# Patient Record
Sex: Male | Born: 1965 | Race: White | Hispanic: No | Marital: Married | Smoking: Current every day smoker
Health system: Southern US, Community
[De-identification: ages and names within clinical notes are randomized; demographics above are authoritative.]

## PROBLEM LIST (undated history)

## (undated) DIAGNOSIS — R51 Headache: Secondary | ICD-10-CM

## (undated) DIAGNOSIS — Z8719 Personal history of other diseases of the digestive system: Secondary | ICD-10-CM

## (undated) DIAGNOSIS — R519 Headache, unspecified: Secondary | ICD-10-CM

---
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
## 2008-07-21 ENCOUNTER — Emergency Department (HOSPITAL_COMMUNITY): Admission: EM | Admit: 2008-07-21 | Discharge: 2008-07-21 | Payer: Self-pay | Admitting: Emergency Medicine

## 2010-07-22 ENCOUNTER — Emergency Department (HOSPITAL_COMMUNITY): Admission: EM | Admit: 2010-07-22 | Discharge: 2010-07-22 | Payer: Self-pay | Admitting: Emergency Medicine

## 2014-10-23 ENCOUNTER — Encounter (HOSPITAL_COMMUNITY)
Admission: EM | Disposition: A | Discharge status: Home / Self Care | Payer: Medicaid Other | Source: Other Acute Inpatient Hospital | Attending: Internal Medicine

## 2014-10-23 ENCOUNTER — Inpatient Hospital Stay (HOSPITAL_COMMUNITY)
Admission: EM | Admit: 2014-10-23 | Discharge: 2014-10-26 | DRG: 378 | Disposition: A | Discharge status: Home / Self Care | Payer: Medicaid Other | Source: Other Acute Inpatient Hospital | Attending: Internal Medicine | Admitting: Internal Medicine

## 2014-10-23 ENCOUNTER — Encounter (HOSPITAL_COMMUNITY): Payer: Self-pay | Admitting: Family Medicine

## 2014-10-23 DIAGNOSIS — G43909 Migraine, unspecified, not intractable, without status migrainosus: Secondary | ICD-10-CM | POA: Diagnosis present

## 2014-10-23 DIAGNOSIS — F17210 Nicotine dependence, cigarettes, uncomplicated: Secondary | ICD-10-CM | POA: Diagnosis present

## 2014-10-23 DIAGNOSIS — K922 Gastrointestinal hemorrhage, unspecified: Secondary | ICD-10-CM | POA: Diagnosis present

## 2014-10-23 DIAGNOSIS — R51 Headache: Secondary | ICD-10-CM

## 2014-10-23 DIAGNOSIS — D62 Acute posthemorrhagic anemia: Secondary | ICD-10-CM | POA: Diagnosis present

## 2014-10-23 DIAGNOSIS — K264 Chronic or unspecified duodenal ulcer with hemorrhage: Secondary | ICD-10-CM | POA: Diagnosis present

## 2014-10-23 DIAGNOSIS — K921 Melena: Secondary | ICD-10-CM | POA: Diagnosis present

## 2014-10-23 DIAGNOSIS — R519 Headache, unspecified: Secondary | ICD-10-CM | POA: Diagnosis present

## 2014-10-23 DIAGNOSIS — D649 Anemia, unspecified: Secondary | ICD-10-CM

## 2014-10-23 DIAGNOSIS — R000 Tachycardia, unspecified: Secondary | ICD-10-CM | POA: Diagnosis present

## 2014-10-23 DIAGNOSIS — Z720 Tobacco use: Secondary | ICD-10-CM | POA: Diagnosis present

## 2014-10-23 HISTORY — PX: ESOPHAGOGASTRODUODENOSCOPY: SHX5428

## 2014-10-23 HISTORY — DX: Personal history of other diseases of the digestive system: Z87.19

## 2014-10-23 HISTORY — DX: Headache, unspecified: R51.9

## 2014-10-23 HISTORY — DX: Headache: R51

## 2014-10-23 LAB — CBC
HEMATOCRIT: 18.9 % — AB (ref 39.0–52.0)
HEMATOCRIT: 21.8 % — AB (ref 39.0–52.0)
HEMOGLOBIN: 6.3 g/dL — AB (ref 13.0–17.0)
Hemoglobin: 7.4 g/dL — ABNORMAL LOW (ref 13.0–17.0)
MCH: 30.6 pg (ref 26.0–34.0)
MCH: 30 pg (ref 26.0–34.0)
MCHC: 33.3 g/dL (ref 30.0–36.0)
MCHC: 33.9 g/dL (ref 30.0–36.0)
MCV: 91.7 fL (ref 78.0–100.0)
MCV: 88.3 fL (ref 78.0–100.0)
Platelets: 197 10*3/uL (ref 150–400)
Platelets: 179 10*3/uL (ref 150–400)
RBC: 2.06 MIL/uL — ABNORMAL LOW (ref 4.22–5.81)
RBC: 2.47 MIL/uL — ABNORMAL LOW (ref 4.22–5.81)
RDW: 12.5 % (ref 11.5–15.5)
RDW: 12.7 % (ref 11.5–15.5)
WBC: 12.5 10*3/uL — AB (ref 4.0–10.5)
WBC: 11.5 10*3/uL — ABNORMAL HIGH (ref 4.0–10.5)

## 2014-10-23 LAB — COMPREHENSIVE METABOLIC PANEL
ALT: 9 U/L (ref 0–53)
ANION GAP: 8 (ref 5–15)
AST: 12 U/L (ref 0–37)
Albumin: 2.3 g/dL — ABNORMAL LOW (ref 3.5–5.2)
Alkaline Phosphatase: 43 U/L (ref 39–117)
BUN: 38 mg/dL — ABNORMAL HIGH (ref 6–23)
CALCIUM: 7.5 mg/dL — AB (ref 8.4–10.5)
CHLORIDE: 112 meq/L (ref 96–112)
CO2: 23 meq/L (ref 19–32)
CREATININE: 0.79 mg/dL (ref 0.50–1.35)
GFR calc non Af Amer: >90 mL/min (ref >90)
GLUCOSE: 116 mg/dL — AB (ref 70–99)
Potassium: 4.5 mEq/L (ref 3.7–5.3)
SODIUM: 143 meq/L (ref 137–147)
TOTAL PROTEIN: 4.5 g/dL — AB (ref 6.0–8.3)

## 2014-10-23 LAB — ABO/RH: ABO/RH(D): O POS

## 2014-10-23 LAB — LIPID PANEL
CHOLESTEROL: 105 mg/dL (ref 0–200)
HDL: 15 mg/dL — AB (ref >39)
LDL Cholesterol: 26 mg/dL (ref 0–99)
TRIGLYCERIDES: 318 mg/dL — AB (ref <150)
Total CHOL/HDL Ratio: 7 RATIO
VLDL: 64 mg/dL — AB (ref 0–40)

## 2014-10-23 LAB — APTT: aPTT: 29 seconds (ref 24–37)

## 2014-10-23 LAB — MRSA PCR SCREENING: MRSA BY PCR: POSITIVE — AB

## 2014-10-23 LAB — GLUCOSE, CAPILLARY: GLUCOSE-CAPILLARY: 122 mg/dL — AB (ref 70–99)

## 2014-10-23 LAB — PROTIME-INR
INR: 1.18 (ref 0.00–1.49)
PROTHROMBIN TIME: 15.1 s (ref 11.6–15.2)

## 2014-10-23 LAB — PREPARE RBC (CROSSMATCH)

## 2014-10-23 LAB — MAGNESIUM: Magnesium: 1.8 mg/dL (ref 1.5–2.5)

## 2014-10-23 LAB — TROPONIN I: Troponin I: <0.3 ng/mL (ref <0.30)

## 2014-10-23 SURGERY — EGD (ESOPHAGOGASTRODUODENOSCOPY)
Anesthesia: Moderate Sedation

## 2014-10-23 MED ORDER — DIPHENHYDRAMINE HCL 50 MG/ML IJ SOLN
INTRAMUSCULAR | Status: AC
  Filled 2014-10-23: qty 1

## 2014-10-23 MED ORDER — SODIUM CHLORIDE 0.45 % IV SOLN
INTRAVENOUS | Status: DC
  Administered 2014-10-23 – 2014-10-25 (×5): via INTRAVENOUS

## 2014-10-23 MED ORDER — SODIUM CHLORIDE 0.9 % IV SOLN
8.0000 mg/h | INTRAVENOUS | Status: AC
  Administered 2014-10-23 – 2014-10-25 (×5): 8 mg/h via INTRAVENOUS
  Filled 2014-10-23 (×13): qty 80

## 2014-10-23 MED ORDER — MUPIROCIN 2 % EX OINT
1.0000 "application " | TOPICAL_OINTMENT | Freq: Two times a day (BID) | CUTANEOUS | Status: DC
  Administered 2014-10-23 – 2014-10-25 (×6): 1 via NASAL
  Filled 2014-10-23 (×2): qty 22

## 2014-10-23 MED ORDER — FENTANYL CITRATE 0.05 MG/ML IJ SOLN
INTRAMUSCULAR | Status: AC
  Filled 2014-10-23: qty 2

## 2014-10-23 MED ORDER — PANTOPRAZOLE SODIUM 40 MG IV SOLR
40.0000 mg | Freq: Two times a day (BID) | INTRAVENOUS | Status: DC
  Filled 2014-10-23 (×2): qty 40

## 2014-10-23 MED ORDER — SODIUM CHLORIDE 0.9 % IV SOLN
Freq: Once | INTRAVENOUS | Status: AC
  Administered 2014-10-23: 07:00:00 via INTRAVENOUS

## 2014-10-23 MED ORDER — FENTANYL CITRATE 0.05 MG/ML IJ SOLN
INTRAMUSCULAR | Status: DC | PRN
  Administered 2014-10-23 (×2): 25 ug via INTRAVENOUS

## 2014-10-23 MED ORDER — MORPHINE SULFATE 4 MG/ML IJ SOLN
4.0000 mg | INTRAMUSCULAR | Status: DC | PRN
  Administered 2014-10-23 – 2014-10-25 (×10): 4 mg via INTRAVENOUS
  Filled 2014-10-23 (×10): qty 1

## 2014-10-23 MED ORDER — SODIUM CHLORIDE 0.9 % IV SOLN: INTRAVENOUS | Status: DC

## 2014-10-23 MED ORDER — ONDANSETRON HCL 4 MG/2ML IJ SOLN
4.0000 mg | Freq: Once | INTRAMUSCULAR | Status: AC
  Administered 2014-10-23: 4 mg via INTRAVENOUS
  Filled 2014-10-23: qty 2

## 2014-10-23 MED ORDER — ONDANSETRON HCL 4 MG PO TABS
4.0000 mg | ORAL_TABLET | Freq: Four times a day (QID) | ORAL | Status: DC | PRN
Start: 2014-10-23 — End: 2014-10-26

## 2014-10-23 MED ORDER — NICOTINE 21 MG/24HR TD PT24
21.0000 mg | MEDICATED_PATCH | Freq: Every day | TRANSDERMAL | Status: DC
  Administered 2014-10-23 – 2014-10-25 (×3): 21 mg via TRANSDERMAL
  Filled 2014-10-23 (×4): qty 1

## 2014-10-23 MED ORDER — ACETAMINOPHEN 10 MG/ML IV SOLN
1000.0000 mg | Freq: Once | INTRAVENOUS | Status: AC
  Administered 2014-10-23: 1000 mg via INTRAVENOUS
  Filled 2014-10-23: qty 100

## 2014-10-23 MED ORDER — MIDAZOLAM HCL 10 MG/2ML IJ SOLN
INTRAMUSCULAR | Status: DC | PRN
  Administered 2014-10-23: 1 mg via INTRAVENOUS
  Administered 2014-10-23: 1.5 mg via INTRAVENOUS
  Administered 2014-10-23: 2 mg via INTRAVENOUS

## 2014-10-23 MED ORDER — SODIUM CHLORIDE 0.9 % IV SOLN: Freq: Once | INTRAVENOUS | Status: AC

## 2014-10-23 MED ORDER — BUTAMBEN-TETRACAINE-BENZOCAINE 2-2-14 % EX AERO
INHALATION_SPRAY | CUTANEOUS | Status: DC | PRN
  Administered 2014-10-23 (×2): 1 via TOPICAL

## 2014-10-23 MED ORDER — TRAMADOL HCL 50 MG PO TABS
50.0000 mg | ORAL_TABLET | Freq: Four times a day (QID) | ORAL | Status: DC | PRN
  Administered 2014-10-23 – 2014-10-25 (×7): 100 mg via ORAL
  Filled 2014-10-23 (×7): qty 2

## 2014-10-23 MED ORDER — SODIUM CHLORIDE 0.9 % IV BOLUS (SEPSIS)
1000.0000 mL | Freq: Once | INTRAVENOUS | Status: AC
  Administered 2014-10-23: 1000 mL via INTRAVENOUS

## 2014-10-23 MED ORDER — ONDANSETRON HCL 4 MG/2ML IJ SOLN: 4.0000 mg | Freq: Four times a day (QID) | INTRAMUSCULAR | Status: DC | PRN

## 2014-10-23 MED ORDER — ACETAMINOPHEN 650 MG RE SUPP: 650.0000 mg | Freq: Four times a day (QID) | RECTAL | Status: DC | PRN

## 2014-10-23 MED ORDER — DEXAMETHASONE SODIUM PHOSPHATE 10 MG/ML IJ SOLN
10.0000 mg | Freq: Once | INTRAMUSCULAR | Status: AC
  Administered 2014-10-23: 10 mg via INTRAVENOUS
  Filled 2014-10-23: qty 1

## 2014-10-23 MED ORDER — MIDAZOLAM HCL 5 MG/ML IJ SOLN
INTRAMUSCULAR | Status: AC
  Filled 2014-10-23: qty 2

## 2014-10-23 MED ORDER — ACETAMINOPHEN 325 MG PO TABS: 650.0000 mg | ORAL_TABLET | Freq: Four times a day (QID) | ORAL | Status: DC | PRN

## 2014-10-23 MED ORDER — CHLORHEXIDINE GLUCONATE CLOTH 2 % EX PADS
6.0000 | MEDICATED_PAD | Freq: Every day | CUTANEOUS | Status: DC
  Administered 2014-10-23 – 2014-10-26 (×4): 6 via TOPICAL

## 2014-10-23 NOTE — H&P (View-Only) (Signed)
[facility] Consultation Note  Referring Provider: Dr. Butler Denmarkizwan [facility]  Reason for Consultation:  Melena, anemia  HPI: Teena IraniWilliam R Siglin is a 48 y.o. male transferred [facility] for evaluation of anemia and melena.  History of migraine headaches, for which he has been taking multiple NSAIDs of various types for the past couple months.  Few days ago, patient began having several black tarry stools daily, and this has persisted.  Mild epigastric discomfort.  Some nausea and vomiting.  Recently on amoxicillin for possible sinus infection.  Possible hematemesis last week, patient uncertain.  Had similar episode of bleeding about 30 years ago, diagnosed with stomach ulcer via what sounds like UGI series test.     Past Medical History  Diagnosis Date  . H/O: GI bleed 1978  . Headache     History reviewed. No pertinent past surgical history.  Prior to Admission medications   Not on File    Current Facility-Administered Medications  Medication Dose Route Frequency Provider Last Rate Last Dose  . 0.45 % sodium chloride infusion   Intravenous Continuous Ozella Rocksavid J Merrell, MD      . Chlorhexidine Gluconate Cloth 2 % PADS 6 each  6 each Topical Q0600 Ozella Rocksavid J Merrell, MD   6 each at 10/23/14 0100  . morphine 4 MG/ML injection 4 mg  4 mg Intravenous Q4H PRN Ozella Rocksavid J Merrell, MD   4 mg at 10/23/14 0854  . mupirocin ointment (BACTROBAN) 2 % 1 application  1 application Nasal BID Ozella Rocksavid J Merrell, MD   1 application at 10/23/14 1021  . nicotine (NICODERM CQ - dosed in mg/24 hours) patch 21 mg  21 mg Transdermal Daily Ozella Rocksavid J Merrell, MD   21 mg at 10/23/14 1021  . ondansetron (ZOFRAN) tablet 4 mg  4 mg Oral Q6H PRN Ozella Rocksavid J Merrell, MD       Or  . ondansetron [facility](ZOFRAN) injection 4 mg  4 mg Intravenous Q6H PRN Ozella Rocksavid J Merrell, MD      . pantoprazole (PROTONIX) 80 mg in sodium chloride 0.9 % 250 mL (0.32 mg/mL) infusion  8 mg/hr Intravenous Continuous Ozella Rocksavid J Merrell, MD 25 mL/hr at 10/23/14 0241 8  mg/hr at 10/23/14 0241  . [START ON 10/26/2014] pantoprazole (PROTONIX) injection 40 mg  40 mg Intravenous Q12H Ozella Rocksavid J Merrell, MD        Allergies as of 10/22/2014  . (Not on File)    History reviewed. No pertinent family history.  History   Social History  . Marital Status: Married    Spouse Name: N/A    Number of Children: N/A  . Years of Education: N/A   Occupational History  . Not on file.   Social History Main Topics  . Smoking status: Current Every Day Smoker -- 1.00 packs/day    Types: Cigarettes  . Smokeless tobacco: Not on file  . Alcohol Use: Yes     Comment: occasional  . Drug Use: Not on file  . Sexual Activity: Not on file   Other Topics Concern  . Not on file   Social History Narrative  . No narrative on file    Review of Systems: ROS Dr. Konrad DoloresMerrell 10/23/14 reviewed and I agree  Physical Exam: Vital signs in last 24 hours: Temp:  [97.5 F (36.4 C)-98.7 F (37.1 C)] 97.5 F (36.4 C) (10/31 0930) Pulse Rate:  [76-103] 81 (10/31 1000) Resp:  [8-18] 10 (10/31 1000) BP: (98-122)/(53-75) 110/61 mmHg (10/31 1000) SpO2:  [98 %-100 %] 98 % (  10/31 1000) Weight:  [92.7 kg (204 lb 5.9 oz)] 92.7 kg (204 lb 5.9 oz) (10/31 0037) Last BM Date: 10/22/14 General:   Alert,  Well-developed, well-nourished, pleasant and cooperative in NAD Head:  Normocephalic and atraumatic. Eyes:  Sclera clear, no icterus.   Conjunctiva pale Ears:  Normal auditory acuity. Nose:  No deformity, discharge,  or lesions. Mouth:  No deformity or lesions.  Oropharynx pale and dry Neck:  Supple; no masses or thyromegaly. Lungs:  Clear throughout to auscultation.   No wheezes, crackles, or rhonchi. No acute distress. Heart:  Regular rate and rhythm; no murmurs, clicks, rubs,  or gallops. Abdomen:  Soft, mild epigastric tenderness, no peritonitis.  Active bowel sounds. No masses, hepatosplenomegaly or hernias noted.   Msk:  Symmetrical without gross deformities. Normal posture. Pulses:   Normal pulses noted. Extremities:  Without clubbing or edema. Neurologic:  Alert and  oriented x4;  Diffusely weak, otherwise grossly normal neurologically. Skin:  Intact without significant lesions or rashes. Psych:  Alert and cooperative. Normal mood and affect.   Lab Results:  Recent Labs  10/23/14 0417  WBC 12.5*  HGB 6.3*  HCT 18.9*  PLT 197   BMET  Recent Labs  10/23/14 0417  NA 143  K 4.5  CL 112  CO2 23  GLUCOSE 116*  BUN 38*  CREATININE 0.79  CALCIUM 7.5*   LFT  Recent Labs  10/23/14 0417  PROT 4.5*  ALBUMIN 2.3*  AST 12  ALT 9  ALKPHOS 43  BILITOT <0.2*   PT/INR  Recent Labs  10/23/14 0417  LABPROT 15.1  INR 1.18    Studies/Results: No results found.  Impression:  1.  Melena.  NSAID use.  Suspect peptic ulcer. 2.  Acute blood loss anemia, from #1 above.  Plan:  1.  Continue pantoprazole infustion. 2.  Continued volume repletion and transfusion (just finished one unit of PRBCs). 3.  Endoscopy today. 4.  Risks (bleeding, infection, bowel perforation that could require surgery, sedation-related changes in cardiopulmonary systems), benefits (identification and possible treatment of source of symptoms, exclusion of certain causes of symptoms), and alternatives (watchful waiting, radiographic imaging studies, empiric medical treatment) of upper endoscopy (EGD) were explained to patient/family in detail and patient wishes to proceed.   LOS: 0 days   Shanyah Gattuso,Raijon M  10/23/2014, 10:35 AM

## 2014-10-23 NOTE — Consult Note (Signed)
[facility] Consultation Note  Referring Provider: Dr. Butler Denmarkizwan [facility]  Reason for Consultation:  Melena, anemia  HPI: Steve James is a 48 y.o. male transferred from [facility] for evaluation of anemia and melena.  History of migraine headaches, for which he has been taking multiple NSAIDs of various types for the past couple months.  Few days ago, patient began having several black tarry stools daily, and this has persisted.  Mild epigastric discomfort.  Some nausea and vomiting.  Recently on amoxicillin for possible sinus infection.  Possible hematemesis last week, patient uncertain.  Had similar episode of bleeding about 30 years ago, diagnosed with stomach ulcer via what sounds like UGI series test.     Past Medical History  Diagnosis Date  . H/O: GI bleed 1978  . Headache     History reviewed. No pertinent past surgical history.  Prior to Admission medications   Not on File    Current Facility-Administered Medications  Medication Dose Route Frequency Provider Last Rate Last Dose  . 0.45 % sodium chloride infusion   Intravenous Continuous Ozella Rocksavid J Merrell, MD      . Chlorhexidine Gluconate Cloth 2 % PADS 6 each  6 each Topical Q0600 Ozella Rocksavid J Merrell, MD   6 each at 10/23/14 0100  . morphine 4 MG/ML injection 4 mg  4 mg Intravenous Q4H PRN Ozella Rocksavid J Merrell, MD   4 mg at 10/23/14 0854  . mupirocin ointment (BACTROBAN) 2 % 1 application  1 application Nasal BID Ozella Rocksavid J Merrell, MD   1 application at 10/23/14 1021  . nicotine (NICODERM CQ - dosed in mg/24 hours) patch 21 mg  21 mg Transdermal Daily Ozella Rocksavid J Merrell, MD   21 mg at 10/23/14 1021  . ondansetron (ZOFRAN) tablet 4 mg  4 mg Oral Q6H PRN Ozella Rocksavid J Merrell, MD       Or  . ondansetron [facility](ZOFRAN) injection 4 mg  4 mg Intravenous Q6H PRN Ozella Rocksavid J Merrell, MD      . pantoprazole (PROTONIX) 80 mg in sodium chloride 0.9 % 250 mL (0.32 mg/mL) infusion  8 mg/hr Intravenous Continuous Ozella Rocksavid J Merrell, MD 25 mL/hr at 10/23/14 0241 8  mg/hr at 10/23/14 0241  . [START ON 10/26/2014] pantoprazole (PROTONIX) injection 40 mg  40 mg Intravenous Q12H Ozella Rocksavid J Merrell, MD        Allergies as of 10/22/2014  . (Not on File)    History reviewed. No pertinent family history.  History   Social History  . Marital Status: Married    Spouse Name: N/A    Number of Children: N/A  . Years of Education: N/A   Occupational History  . Not on file.   Social History Main Topics  . Smoking status: Current Every Day Smoker -- 1.00 packs/day    Types: Cigarettes  . Smokeless tobacco: Not on file  . Alcohol Use: Yes     Comment: occasional  . Drug Use: Not on file  . Sexual Activity: Not on file   Other Topics Concern  . Not on file   Social History Narrative  . No narrative on file    Review of Systems: ROS Dr. Konrad DoloresMerrell 10/23/14 reviewed and I agree  Physical Exam: Vital signs in last 24 hours: Temp:  [97.5 F (36.4 C)-98.7 F (37.1 C)] 97.5 F (36.4 C) (10/31 0930) Pulse Rate:  [76-103] 81 (10/31 1000) Resp:  [8-18] 10 (10/31 1000) BP: (98-122)/(53-75) 110/61 mmHg (10/31 1000) SpO2:  [98 %-100 %] 98 % (  10/31 1000) Weight:  [92.7 kg (204 lb 5.9 oz)] 92.7 kg (204 lb 5.9 oz) (10/31 0037) Last BM Date: 10/22/14 General:   Alert,  Well-developed, well-nourished, pleasant and cooperative in NAD Head:  Normocephalic and atraumatic. Eyes:  Sclera clear, no icterus.   Conjunctiva pale Ears:  Normal auditory acuity. Nose:  No deformity, discharge,  or lesions. Mouth:  No deformity or lesions.  Oropharynx pale and dry Neck:  Supple; no masses or thyromegaly. Lungs:  Clear throughout to auscultation.   No wheezes, crackles, or rhonchi. No acute distress. Heart:  Regular rate and rhythm; no murmurs, clicks, rubs,  or gallops. Abdomen:  Soft, mild epigastric tenderness, no peritonitis.  Active bowel sounds. No masses, hepatosplenomegaly or hernias noted.   Msk:  Symmetrical without gross deformities. Normal posture. Pulses:   Normal pulses noted. Extremities:  Without clubbing or edema. Neurologic:  Alert and  oriented x4;  Diffusely weak, otherwise grossly normal neurologically. Skin:  Intact without significant lesions or rashes. Psych:  Alert and cooperative. Normal mood and affect.   Lab Results:  Recent Labs  10/23/14 0417  WBC 12.5*  HGB 6.3*  HCT 18.9*  PLT 197   BMET  Recent Labs  10/23/14 0417  NA 143  K 4.5  CL 112  CO2 23  GLUCOSE 116*  BUN 38*  CREATININE 0.79  CALCIUM 7.5*   LFT  Recent Labs  10/23/14 0417  PROT 4.5*  ALBUMIN 2.3*  AST 12  ALT 9  ALKPHOS 43  BILITOT <0.2*   PT/INR  Recent Labs  10/23/14 0417  LABPROT 15.1  INR 1.18    Studies/Results: No results found.  Impression:  1.  Melena.  NSAID use.  Suspect peptic ulcer. 2.  Acute blood loss anemia, from #1 above.  Plan:  1.  Continue pantoprazole infustion. 2.  Continued volume repletion and transfusion (just finished one unit of PRBCs). 3.  Endoscopy today. 4.  Risks (bleeding, infection, bowel perforation that could require surgery, sedation-related changes in cardiopulmonary systems), benefits (identification and possible treatment of source of symptoms, exclusion of certain causes of symptoms), and alternatives (watchful waiting, radiographic imaging studies, empiric medical treatment) of upper endoscopy (EGD) were explained to patient/family in detail and patient wishes to proceed.   LOS: 0 days   Sophonie Goforth,Anas M  10/23/2014, 10:35 AM   

## 2014-10-23 NOTE — Progress Notes (Signed)
CRITICAL VALUE ALERT  Critical value received:  + MRSA swab  Date of notification:  10/23/2014  Time of notification:  4:50 AM  Critical value read back:Yes.    Nurse who received alert:  Lorenda CahillHopper, Donzell Coller Anderson1  MD notified (1st page):  Dr. Romeo AppleMerrrell  Time of first page:  4:50 AM  MD notified (2nd page):  Time of second page:  Responding MD:  Per protocol  Time MD responded:  4:50 AM

## 2014-10-23 NOTE — Progress Notes (Signed)
CRITICAL VALUE ALERT  Critical value received:  hgb 6.3  Date of notification:  10/23/2014  Time of notification:  0619  Critical value read back:Yes.    Nurse who received alert:  Marcellina MillinMindy Hopper RN reported to Crist FatJoy Zarin Knupp RN  MD notified (1st page):  Rexford MausMary Welch NP-[facility]   Time of first page:  0630  MD notified (2nd page):  Time of second page:  Responding MD:  Rexford MausWelch, Mary  Time MD responded:  [phone]

## 2014-10-23 NOTE — Interval H&P Note (Signed)
History and Physical Interval Note:  10/23/2014 11:36 AM  Steve James  has presented today for surgery, with the diagnosis of melena  The various methods of treatment have been discussed with the patient and family. After consideration of risks, benefits and other options for treatment, the patient has consented to  Procedure(s): ESOPHAGOGASTRODUODENOSCOPY (EGD) (N/A) as a surgical intervention .  The patient's history has been reviewed, patient examined, no change in status, stable for surgery.  I have reviewed the patient's chart and labs.  Questions were answered to the patient's satisfaction.     Steve James,Steve James  Assessment:  1.  Melena. 2.  Anemia.  Plan:  1.  Endoscopy. 2.  Risks (bleeding, infection, bowel perforation that could require surgery, sedation-related changes in cardiopulmonary systems), benefits (identification and possible treatment of source of symptoms, exclusion of certain causes of symptoms), and alternatives (watchful waiting, radiographic imaging studies, empiric medical treatment) of upper endoscopy (EGD) were explained to patient/family in detail and patient wishes to proceed.

## 2014-10-23 NOTE — Op Note (Signed)
[facility] [address] [place], [id number]   ENDOSCOPY PROCEDURE REPORT  PATIENT: Steve James, Steve James  MR#: [id number] BIRTHDATE: 02/17/1966 , 48  yrs. old GENDER: male ENDOSCOPIST: Willis ModenaWilliam Glenys Snader, MD REFERRED BY:  [facility] PROCEDURE DATE:  10/23/2014 PROCEDURE:  EGD, diagnostic ASA CLASS:     Class II INDICATIONS:  melena, anemia. MEDICATIONS: Fentanyl 50 mcg IV and Versed 5.5 mg IV TOPICAL ANESTHETIC: Cetacaine Spray DESCRIPTION OF PROCEDURE: After the risks benefits and alternatives of the procedure were thoroughly explained, informed consent was obtained.  The Pentax Gastroscope [id number] endoscope was introduced through the mouth and advanced to the second portion of the duodenum. The instrument was slowly withdrawn as the mucosa was fully examined.  Findings:   Normal esophagus.  Streaks of red blood in fundus; after extensive lavage, no focal gastric bleeding source was seen. Retroflexion into the cardia was normal.  In the distal duodenal bulb, an adherent clot was identified.  Clot was gently evacuated, revealing a large deep distal duodenal bulbar ulcer.  There were couple pigmented spots within the ulcer, but no obvious visible vessel was seen that would have been amenable to clipping.  No focal region was identified to perform electrocautery on either, and risk for cautery-mediated perforation in such a deep ulcer was deemed not insignifcant.  No active bleeding was witnessed from the ulcer, after clot evacuation, despite extensive lavage.  There was some post-bulbar edema and stenosis, likely related to the ulcer, but the diagnostic endoscope was able to be maneuvered through this region and into the second portion of the duodenum, which was normal.              The scope was then withdrawn from the patient and the procedure completed.  COMPLICATIONS: There were no immediate complications.  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  No active bleeding from ulcer bed witnessed.    Patient is at a not insignificant risk for rebleeding but, unfortunately, there was no focal site to endoscopically treat that would decrease this interval risk.  RECOMMENDATIONS:     1.  Watch for potential complications of procedure. 2.  Pantoprazole infusion/drip for 72 hours. 3.  Follow serial CBCs, patient may very well require further transfusions. 4.  No NSAIDs. 5.  Ice Chips for now only; if no further rebleeding throughout the rest of the day, might consider clear liquid diet for dinner tonight. 6.  If patient has further rebleeding, would consider Interventional Radiology consultation for embolization of the gastroduodenal artery, as there is no role for endoscopy should this patient rebleed.  eSigned:  Willis ModenaWilliam Shenequa Howse, MD 10/23/2014 12:19 PM   CC: CPT CODES: ICD CODES:

## 2014-10-23 NOTE — H&P (Addendum)
[facility] History and Physical  Steve IraniWilliam R James UJW:[id number] DOB: [birth year] DOA: 10/23/2014  [facility] PCP: No primary provider on file.   Chief Complaint: lightheadedness and headache  HPI: Steve James is a 48 y.o. male  Started on Amoxicillin on Monday for a sinus infection. Started bloody diarrhea on Tuesday. Stopped the ABX but continued w/ bloody diarrhea 2-3x daily. Became lightheaded today. Associated w/ epigastric burning pain. Feels like his bleeding ulcer that he had in 1978. Pt takes up to 5 advil and 2 excedrine for frequent HAs (3 per week).  Increased use of NSAIDs over the past month. [facility] and transferred here for GI evaluation. Started on NS 250mg /hr and protonix drip.   HAs area throbbing in nature, on one side of head. Associated w/ photophobia or phonophobia, nausea, and weakness. If able to take medications at the beginning of the HA then it goes away. If unable to get medications early then it will last for a day.   Smokes 1ppd.   Review of Systems:  Constitutional:  No weight loss, night sweats, Fevers, chills, HEENT:  No difficulty swallowing,Tooth/dental problems,Sore throat,  No sneezing, itching, ear ache, nasal congestion, post nasal drip,  Cardio-vascular:  No chest pain, Orthopnea, PND, swelling in lower extremities, anasarca, dizziness, palpitations  GI:  PER HPI Resp:  No shortness of breath with exertion or at rest. No productive cough, No non-productive cough, No coughing up of blood.No change in color of mucus.No wheezing.No chest wall deformity  Skin:  no rash or lesions.  GU:  no dysuria, change in color of urine, no urgency or frequency. No flank pain.  Musculoskeletal:  No joint pain or swelling. No decreased range of motion. No back pain.  Psych:  No change in mood or affect. No depression or anxiety. No memory loss.   Past Medical History  Diagnosis Date  . H/O: GI bleed  1978   No past surgical history on file. Social History:  reports that he has been smoking Cigarettes.  He has been smoking about 1.00 pack per day. He does not have any smokeless tobacco history on file. He reports that he drinks alcohol. His drug history is not on file.  Allergies  Allergen Reactions  . Aspirin Nausea Only  . Bee Pollen     Bee pollen and stings    No family history on file.   Prior to Admission medications   Not on File   Physical Exam: Filed Vitals:   10/23/14 0100 10/23/14 0115 10/23/14 0130 10/23/14 0200  BP: 109/66 107/72 119/60 114/64  Pulse: 96 88 103 91  Temp:      TempSrc:      Resp: 13 13 18 11   Height:      Weight:      SpO2: 100% 100% 100% 100%    Wt Readings from Last 3 Encounters:  10/23/14 92.7 kg (204 lb 5.9 oz)    General: Appears calm and comfortable Eyes: EOMI, PERRL, normal lids, irises & conjunctiva ENT: Dry mm, no nasal congestion  Neck: no LAD, masses or thyromegaly Cardiovascular: RRR, no m/r/g. No LE edema. Telemetry: SR, no arrhythmias  Respiratory: CTA bilaterally, no w/r/r. Normal respiratory effort. Abdomen: soft, ntnd Skin: Pale appearing, no rash or induration seen on limited exam Musculoskeletal: grossly normal tone BUE/BLE Psychiatric: grossly normal mood and affect, speech fluent and appropriate Neurologic:  grossly non-focal.          Labs on Admission:  Basic Metabolic Panel: No results found for this basename: NA, K, CL, CO2, GLUCOSE, BUN, CREATININE, CALCIUM, MG, PHOS,  in the last 168 hours Liver Function Tests: No results found for this basename: AST, ALT, ALKPHOS, BILITOT, PROT, ALBUMIN,  in the last 168 hours No results found for this basename: LIPASE, AMYLASE,  in the last 168 hours No results found for this basename: AMMONIA,  in the last 168 hours CBC: No results found for this basename: WBC, NEUTROABS, HGB, HCT, MCV, PLT,  in the last 168 hours Cardiac Enzymes: No results found for this  basename: CKTOTAL, CKMB, CKMBINDEX, TROPONINI,  in the last 168 hours  BNP (last 3 results) No results found for this basename: PROBNP,  in the last 8760 hours CBG:  Recent Labs Lab 10/23/14 0049  GLUCAP 122*    Radiological Exams on Admission: No results found.  EKG: Independently reviewed. NSR, no signs of ischemia - [facility] EKG  Assessment/Plan Principal Problem:   GI bleed Active Problems:   Tobacco abuse   Anemia  GI bleed: [facility] show Hgb 9.1, Hct 27.2, INR 1, WBC 11.6. Hpylori was neg and AXR unremarkable. Stool guaiac +. GI not available [facility] Dr. Dulce Sellarutlaw was made aware and will see the pt in the morning. Pt started on a protonix drip and IVF. Significant NSAID use and overuse recently likely contributed. Pt is a long time smoker.  - Pt admitted to step down overflow in the ICU - f/u w/ Dr. Dulce Sellarutlaw in the am. Likely to scope pt.  - continue protonix drip - hold NSAIDS - NPO - IVF 1/2NS 12100ml/hr - CBC, Coags, CMET - stool guaiac - Type and screen   HA: likely migraine type. No focal or global neurological deficits.   - Tylenol 1gm x1 - Decadron 10mg  - zofran - consider imitrex if not relieved and Hgb improves.  - Consider imaging if worsens or does not improve  Tachycardia: initially w/ some low pressures and [facility]. Improved after [facility]L [facility]  - EKG - tele - NS bolus 1L - EVF as above.  - troponin x1  Tobacco abuse: long time smoker. 1ppd - nicotine patch - tobacco cessation  Anemia: likely acute blood loss.  - trend CBC - consider anemia panel if not improving or microcytic/macrocytic.   Screening labs: A1c, Lipid panel  Code Status: FULL DVT Prophylaxis: SCD Family Communication: None Disposition Plan: pending improvement  Time spent: >70 min in direct pt care and coordination  Steve James J [facility] [email or web address] Password [email or web address]

## 2014-10-23 NOTE — Progress Notes (Signed)
[facility] Progress Note   Steve IraniWilliam R Chadderdon RUE:[id number] DOB: [birth year] DOA: 10/23/2014 PCP: No primary provider on file.  Brief narrative: Steve James is a 48 y.o. male admitted for 2 days of bloody stools in setting of NSAID use for headaches which have been increasing in frequency recently.     Subjective: Last episode of bloody stool yesterday.   Assessment/Plan: Principal Problem:   GI bleed - s/p EGD - found to have a duodenal ulcer with adherant clot- no bleeding noted - advises to discontinue NSAIDs - cont Protonix infusion for now - GI assisting with management  Active Problems:   Acute blood loss anemia - s/p 1 U PRBC today- will transfuse to keep Hgb > 8    Tachycardia - related to acute blood loss- improving    Tobacco abuse - advised to discontinue    Frequent Headaches - try Tylenol and Ultram as needed    Code Status: Full code Family Communication:  Disposition Plan: home when stable DVT prophylaxis: SCDs  Consultants: GI  Procedures: none  Antibiotics: Anti-infectives   None      Objective: Filed Weights   10/23/14 0037  Weight: 92.7 kg (204 lb 5.9 oz)    Intake/Output Summary (Last 24 hours) at 10/23/14 1250 Last data filed at 10/23/14 1200  Gross per 24 hour  Intake 2544.59 ml  Output   1350 ml  Net 1194.59 ml     Vitals Filed Vitals:   10/23/14 1200 10/23/14 1205 10/23/14 1208 10/23/14 1210  BP: 99/47 95/38 96/40  97/36  Pulse: 88 86 88   Temp:  98.6 F (37 C)    TempSrc:  Oral    Resp: 11 13 13    Height:      Weight:      SpO2: 100% 100% 99%     Exam: General: No acute respiratory distress Lungs: Clear to auscultation bilaterally without wheezes or crackles Cardiovascular: Regular rate and rhythm without murmur gallop or rub normal S1 and S2 Abdomen: Nontender, nondistended, soft, bowel sounds positive, no rebound, no ascites, no appreciable mass Extremities: No significant cyanosis, clubbing, or  edema bilateral lower extremities  Data Reviewed: Basic Metabolic Panel:  Recent Labs Lab 10/23/14 0417  NA 143  K 4.5  CL 112  CO2 23  GLUCOSE 116*  BUN 38*  CREATININE 0.79  CALCIUM 7.5*  MG 1.8   Liver Function Tests:  Recent Labs Lab 10/23/14 0417  AST 12  ALT 9  ALKPHOS 43  BILITOT <0.2*  PROT 4.5*  ALBUMIN 2.3*   No results found for this basename: LIPASE, AMYLASE,  in the last 168 hours No results found for this basename: AMMONIA,  in the last 168 hours CBC:  Recent Labs Lab 10/23/14 0417 10/23/14 1053  WBC 12.5* 11.5*  HGB 6.3* 7.4*  HCT 18.9* 21.8*  MCV 91.7 88.3  PLT 197 179   Cardiac Enzymes:  Recent Labs Lab 10/23/14 0417  TROPONINI <0.30   BNP (last 3 results) No results found for this basename: PROBNP,  in the last 8760 hours CBG:  Recent Labs Lab 10/23/14 0049  GLUCAP 122*    Recent Results (from the past 240 hour(s))  MRSA PCR SCREENING     Status: Abnormal   Collection Time    10/23/14 12:51 AM      Result Value Ref Range Status   MRSA by PCR POSITIVE (*) NEGATIVE Final   Comment:  The GeneXpert MRSA Assay (FDA     approved for NASAL specimens     only), is one component of a     comprehensive MRSA colonization     surveillance program. It is not     intended to diagnose MRSA     infection nor to guide or     monitor treatment for     MRSA infections.     RESULT CALLED TO, READ BACK BY AND VERIFIED WITH:     CALLED TO RN MINDY HOPPER [phone] @0448  THANEY     Studies:  Recent x-ray studies have been reviewed in detail by the Attending Physician  Scheduled Meds:  Scheduled Meds: . [MAR HOLD] Chlorhexidine Gluconate Cloth  6 each Topical Q0600  . [facility][MAR HOLD] mupirocin ointment  1 application Nasal BID  . [facility][MAR HOLD] nicotine  21 mg Transdermal Daily  . [facility][MAR HOLD] pantoprazole (PROTONIX) IV  40 mg Intravenous Q12H   Continuous Infusions: . sodium chloride Stopped (10/23/14 0708)  . sodium chloride    .  pantoprozole (PROTONIX) infusion 8 mg/hr (10/23/14 0241)    Time spent on care of this patient: 35 min   Marque Bango, MD 10/23/2014, 12:50 PM  LOS: 0 days   [facility] Office  [phone] Pager - Text Page per [email or web address]  If 7PM-7AM, please contact night-coverage [email or web address]

## 2014-10-24 LAB — CBC
HCT: 19.6 % — ABNORMAL LOW (ref 39.0–52.0)
Hemoglobin: 6.9 g/dL — CL (ref 13.0–17.0)
MCH: 31.4 pg (ref 26.0–34.0)
MCHC: 35.2 g/dL (ref 30.0–36.0)
MCV: 89.1 fL (ref 78.0–100.0)
Platelets: 167 10*3/uL (ref 150–400)
RBC: 2.2 MIL/uL — ABNORMAL LOW (ref 4.22–5.81)
RDW: 13.6 % (ref 11.5–15.5)
WBC: 16.9 10*3/uL — ABNORMAL HIGH (ref 4.0–10.5)

## 2014-10-24 LAB — BASIC METABOLIC PANEL
Anion gap: 9 (ref 5–15)
BUN: 33 mg/dL — ABNORMAL HIGH (ref 6–23)
CHLORIDE: 105 meq/L (ref 96–112)
CO2: 24 mEq/L (ref 19–32)
Calcium: 7.7 mg/dL — ABNORMAL LOW (ref 8.4–10.5)
Creatinine, Ser: 0.85 mg/dL (ref 0.50–1.35)
GLUCOSE: 125 mg/dL — AB (ref 70–99)
POTASSIUM: 4.4 meq/L (ref 3.7–5.3)
SODIUM: 138 meq/L (ref 137–147)

## 2014-10-24 LAB — GLUCOSE, CAPILLARY: Glucose-Capillary: 112 mg/dL — ABNORMAL HIGH (ref 70–99)

## 2014-10-24 LAB — HEMOGLOBIN AND HEMATOCRIT, BLOOD
HCT: 21.8 % — ABNORMAL LOW (ref 39.0–52.0)
Hemoglobin: 7.6 g/dL — ABNORMAL LOW (ref 13.0–17.0)

## 2014-10-24 LAB — PREPARE RBC (CROSSMATCH)

## 2014-10-24 MED ORDER — SODIUM CHLORIDE 0.9 % IV SOLN
Freq: Once | INTRAVENOUS | Status: AC
  Administered 2014-10-24: 09:00:00 via INTRAVENOUS

## 2014-10-24 MED ORDER — SODIUM CHLORIDE 0.9 % IV SOLN
Freq: Once | INTRAVENOUS | Status: AC
  Administered 2014-10-24: 10 mL/h via INTRAVENOUS

## 2014-10-24 NOTE — Progress Notes (Signed)
Subjective: No further bleeding. Is hungry; wants to eat.  Objective: Vital signs in last 24 hours: Temp:  [97.9 F (36.6 C)-98.8 F (37.1 C)] 98.4 F (36.9 C) (11/01 0941) Pulse Rate:  [68-109] 84 (11/01 0941) Resp:  [9-29] 12 (11/01 0941) BP: (87-147)/(34-78) 104/56 mmHg (11/01 0941) SpO2:  [96 %-100 %] 100 % (11/01 0941) Weight change:  Last BM Date: 10/24/14  PE: GEN:  NAD, still pale-appearing ABD:  Soft, very mild epigastric tenderness  Lab Results: CBC    Component Value Date/Time   WBC 16.9* 10/24/2014 0347   RBC 2.20* 10/24/2014 0347   HGB 6.9* 10/24/2014 0347   HCT 19.6* 10/24/2014 0347   PLT 167 10/24/2014 0347   MCV 89.1 10/24/2014 0347   MCH 31.4 10/24/2014 0347   MCHC 35.2 10/24/2014 0347   RDW 13.6 10/24/2014 0347   CMP     Component Value Date/Time   NA 138 10/24/2014 0347   K 4.4 10/24/2014 0347   CL 105 10/24/2014 0347   CO2 24 10/24/2014 0347   GLUCOSE 125* 10/24/2014 0347   BUN 33* 10/24/2014 0347   CREATININE 0.85 10/24/2014 0347   CALCIUM 7.7* 10/24/2014 0347   PROT 4.5* 10/23/2014 0417   ALBUMIN 2.3* 10/23/2014 0417   AST 12 10/23/2014 0417   ALT 9 10/23/2014 0417   ALKPHOS 43 10/23/2014 0417   BILITOT <0.2* 10/23/2014 0417   GFRNONAA >90 10/24/2014 0347   GFRAA >90 10/24/2014 0347   Assessment:  1.  Melena, EGD showed large deep duodenal ulcer, likely NSAID-mediated, with adherent clot that was removed without subsequent focal region amenable to endoscopic therapy.  Bleeding has stopped. 2.  Acute blood loss anemia, due to #1 above.  Plan:  1.  Protonix drip for another 48 hours. 2.  No NSAIDs. 3.  Clear liquid diet, advance slowly as tolerated. 4.  Agree with further blood transfusion, which has been done this morning. 5.  OK to transition out of ICU today, from GI perspective. 6.  Hopefully home in a couple days. 7.  [facility] will follow.   Freddy JakschUTLAW,[place] M 10/24/2014, 11:18 AM

## 2014-10-24 NOTE — Progress Notes (Signed)
CRITICAL VALUE ALERT  Critical value received: Hemoglobin 6.9  Date of notification:  10/24/14  Time of notification:  0515  Critical value read back:Yes  Nurse who received alert:  Eddie NorthJennifer Maddalena Linarez, RN  MD notified (1st page):  [facility] Attending  Time of first page:  0520  MD notified (2nd page):  Time of second page:  Responding MD:    Time MD responded:

## 2014-10-24 NOTE — Progress Notes (Addendum)
[facility] Progress Note   Steve James ZOX:[id number] DOB: Oct 10, 1966 DOA: 10/23/2014 PCP: No primary care provider on file.  Brief narrative: Steve James is a 48 y.o. male admitted for 2 days of bloody stools in setting of NSAID use for headaches which have been increasing in frequency recently.     Subjective: No bloody BMs since being admitted to the hospital  Assessment/Plan: Principal Problem:   GI bleed - s/p EGD - found to have a duodenal ulcer with adherant clot - no further bleeding noted - advised to discontinue NSAIDs - cont Protonix infusion for now - GI assisting with management - started on clear liquids today  Active Problems:   Acute blood loss anemia - s/p 2 U PRBC on 10/31- will transfuse to keep Hgb > 8- giving another transfusion today    Tachycardia - related to acute blood loss- improving    Tobacco abuse - advised to discontinue    Frequent Headaches - Tylenol and Ultram as needed    Code Status: Full code Family Communication:  Disposition Plan: home when stable DVT prophylaxis: SCDs  Consultants: GI  Procedures: none  Antibiotics: Anti-infectives    None      Objective: Filed Weights   10/23/14 0037  Weight: 92.7 kg (204 lb 5.9 oz)    Intake/Output Summary (Last 24 hours) at 10/24/14 1044 Last data filed at 10/24/14 0830  Gross per 24 hour  Intake 3751.5 ml  Output   2125 ml  Net 1626.5 ml     Vitals Filed Vitals:   10/24/14 0900 10/24/14 0915 10/24/14 0926 10/24/14 0941  BP: 106/53   104/56  Pulse: 71  73 84  Temp:  98.6 F (37 C)  98.4 F (36.9 C)  TempSrc:  Oral  Axillary  Resp: 11  10 12   Height:      Weight:      SpO2: 100%  100% 100%    Exam: General: No acute respiratory distress Lungs: Clear to auscultation bilaterally without wheezes or crackles Cardiovascular: Regular rate and rhythm without murmur gallop or rub normal S1 and S2 Abdomen: Nontender, nondistended, soft, bowel sounds  positive, no rebound, no ascites, no appreciable mass Extremities: No significant cyanosis, clubbing, or edema bilateral lower extremities  Data Reviewed: Basic Metabolic Panel:  Recent Labs Lab 10/23/14 0417 10/24/14 0347  NA 143 138  K 4.5 4.4  CL 112 105  CO2 23 24  GLUCOSE 116* 125*  BUN 38* 33*  CREATININE 0.79 0.85  CALCIUM 7.5* 7.7*  MG 1.8  --    Liver Function Tests:  Recent Labs Lab 10/23/14 0417  AST 12  ALT 9  ALKPHOS 43  BILITOT <0.2*  PROT 4.5*  ALBUMIN 2.3*   No results for input(s): LIPASE, AMYLASE in the last 168 hours. No results for input(s): AMMONIA in the last 168 hours. CBC:  Recent Labs Lab 10/23/14 0417 10/23/14 1053 10/24/14 0347  WBC 12.5* 11.5* 16.9*  HGB 6.3* 7.4* 6.9*  HCT 18.9* 21.8* 19.6*  MCV 91.7 88.3 89.1  PLT 197 179 167   Cardiac Enzymes:  Recent Labs Lab 10/23/14 0417  TROPONINI <0.30   BNP (last 3 results) No results for input(s): PROBNP in the last 8760 hours. CBG:  Recent Labs Lab 10/23/14 0049  GLUCAP 122*    Recent Results (from the past 240 hour(s))  MRSA PCR Screening     Status: Abnormal   Collection Time: 10/23/14 12:51 AM  Result Value Ref Range Status  MRSA by PCR POSITIVE (A) NEGATIVE Final    Comment:        The GeneXpert MRSA Assay (FDA approved for NASAL specimens only), is one component of a comprehensive MRSA colonization surveillance program. It is not intended to diagnose MRSA infection nor to guide or monitor treatment for MRSA infections. RESULT CALLED TO, READ BACK BY AND VERIFIED WITH: CALLED TO RN MINDY HOPPER [phone] @0448  THANEY     Studies:  Recent x-ray studies have been reviewed in detail by the Attending Physician  Scheduled Meds:  Scheduled Meds: . Chlorhexidine Gluconate Cloth  6 each Topical Q0600  . mupirocin ointment  1 application Nasal BID  . nicotine  21 mg Transdermal Daily  . [START ON 10/26/2014] pantoprazole (PROTONIX) IV  40 mg Intravenous Q12H    Continuous Infusions: . sodium chloride 100 mL/hr at 10/23/14 1300  . pantoprozole (PROTONIX) infusion 8 mg/hr (10/24/14 0950)    Time spent on care of this patient: 35 min   Laneah Luft, MD 10/24/2014, 10:44 AM  LOS: 1 day   [facility] Office  [phone] Pager - Text Page per [email or web address]  If 7PM-7AM, please contact night-coverage [email or web address]

## 2014-10-25 ENCOUNTER — Encounter (HOSPITAL_COMMUNITY): Payer: Self-pay | Admitting: Gastroenterology

## 2014-10-25 DIAGNOSIS — G44041 Chronic paroxysmal hemicrania, intractable: Secondary | ICD-10-CM

## 2014-10-25 DIAGNOSIS — K264 Chronic or unspecified duodenal ulcer with hemorrhage: Principal | ICD-10-CM

## 2014-10-25 LAB — CBC
HCT: 21.5 % — ABNORMAL LOW (ref 39.0–52.0)
HCT: 25.7 % — ABNORMAL LOW (ref 39.0–52.0)
Hemoglobin: 7.3 g/dL — ABNORMAL LOW (ref 13.0–17.0)
Hemoglobin: 8.8 g/dL — ABNORMAL LOW (ref 13.0–17.0)
MCH: 29.2 pg (ref 26.0–34.0)
MCH: 30.6 pg (ref 26.0–34.0)
MCHC: 34 g/dL (ref 30.0–36.0)
MCHC: 34.2 g/dL (ref 30.0–36.0)
MCV: 86 fL (ref 78.0–100.0)
MCV: 89.2 fL (ref 78.0–100.0)
PLATELETS: 172 10*3/uL (ref 150–400)
Platelets: 153 10*3/uL (ref 150–400)
RBC: 2.5 MIL/uL — ABNORMAL LOW (ref 4.22–5.81)
RBC: 2.88 MIL/uL — ABNORMAL LOW (ref 4.22–5.81)
RDW: 15.3 % (ref 11.5–15.5)
RDW: 15.1 % (ref 11.5–15.5)
WBC: 13.1 10*3/uL — ABNORMAL HIGH (ref 4.0–10.5)
WBC: 11.2 10*3/uL — ABNORMAL HIGH (ref 4.0–10.5)

## 2014-10-25 LAB — HEMOGLOBIN A1C
HEMOGLOBIN A1C: 5.7 % — AB (ref <5.7)
MEAN PLASMA GLUCOSE: 117 mg/dL — AB (ref <117)

## 2014-10-25 LAB — BASIC METABOLIC PANEL
Anion gap: 8 (ref 5–15)
BUN: 20 mg/dL (ref 6–23)
CALCIUM: 7.8 mg/dL — AB (ref 8.4–10.5)
CO2: 25 mEq/L (ref 19–32)
Chloride: 108 mEq/L (ref 96–112)
Creatinine, Ser: 0.84 mg/dL (ref 0.50–1.35)
Glucose, Bld: 103 mg/dL — ABNORMAL HIGH (ref 70–99)
POTASSIUM: 4 meq/L (ref 3.7–5.3)
Sodium: 141 mEq/L (ref 137–147)

## 2014-10-25 LAB — PREPARE RBC (CROSSMATCH)

## 2014-10-25 MED ORDER — BUTALBITAL-APAP-CAFFEINE 50-325-40 MG PO TABS
1.0000 | ORAL_TABLET | Freq: Four times a day (QID) | ORAL | Status: DC | PRN
  Administered 2014-10-25: 2 via ORAL
  Filled 2014-10-25: qty 2

## 2014-10-25 MED ORDER — SUMATRIPTAN SUCCINATE 25 MG PO TABS
25.0000 mg | ORAL_TABLET | ORAL | Status: DC | PRN
  Administered 2014-10-25 (×2): 25 mg via ORAL
  Filled 2014-10-25 (×4): qty 1

## 2014-10-25 MED ORDER — MORPHINE SULFATE 2 MG/ML IJ SOLN: 2.0000 mg | INTRAMUSCULAR | Status: DC | PRN

## 2014-10-25 MED ORDER — SODIUM CHLORIDE 0.9 % IV SOLN
Freq: Once | INTRAVENOUS | Status: AC
  Administered 2014-10-25: 11:00:00 via INTRAVENOUS

## 2014-10-25 NOTE — Progress Notes (Signed)
Report received for transfer to [phone]

## 2014-10-25 NOTE — Plan of Care (Signed)
Problem: Phase II Progression Outcomes Goal: Progress activity as tolerated unless otherwise ordered Outcome: Progressing     

## 2014-10-25 NOTE — Progress Notes (Signed)
Teena IraniWilliam R [place]claw [id number] Code Status: Full Admission Data: 10/25/2014 2:54 PM Attending Provider: Sharon SellerMcClung  PCP:No primary care provider on file. Consults/ Treatment Team: Treatment Team:  Barrie FolkJohn C Hayes, MD  Teena IraniWilliam R Vanwart is a 48 y.o. male patient admitted from ED awake, alert - oriented  X 3 - no acute distress noted.  VSS - Blood pressure 136/63, pulse 80, temperature 99.2 F (37.3 C), temperature source Oral, resp. rate 15, height 6' (1.829 m), weight 92.7 kg (204 lb 5.9 oz), SpO2 100 %.    IV in place, occlusive dsg intact without redness.  Orientation to room, and floor completed with information packet given to patient/family.  Patient declined safety video at this time.  Admission INP armband ID verified with patient/family, and in place.   SR up x 2, fall assessment complete, with patient and family able to verbalize understanding of risk associated with falls, and verbalized understanding to call nsg before up out of bed.  Call light within reach, patient able to voice, and demonstrate understanding.  Skin, clean-dry- intact without evidence of bruising, or skin tears.   No evidence of skin break down noted on exam.     Will cont to eval and treat per MD orders.  Aldean AstLEsperance, Doyt Castellana C, RN 10/25/2014 2:54 PM

## 2014-10-25 NOTE — Progress Notes (Signed)
[facility] TEAM 1 - Stepdown/ICU TEAM Progress Note  Steve James ZOX:[id number] DOB: August 30, 1966 DOA: 10/23/2014 PCP: No primary care provider on file.  Admit HPI / Brief Narrative: 48 y.o. male who was started on Amoxicillin prior to admission for a sinus infection. The day after abx were started he developed bloody diarrhea. He stopped the ABX but the bloody diarrhea continued 2-3x daily. He subsequently became lightheaded, w/ epigastric burning pain. Felt like his bleeding ulcer in 1978. Pt takes up to 5 advil and 2 excedrine for HAs 3 times per week. He [facility]    HPI/Subjective: Pt has not GI complaints.  He has not move his bowels.  He c/o an unrelenting severe generalized HA that is not responding to out current tx.    Assessment/Plan:  Upper GI bleed - s/p EGD - found to have a duodenal ulcer with adherant clot - advised to discontinue NSAIDs - cont Protonix infusion to complete a full 72hrs of tx (through 11/3) - clear liquids only for now - BUN is declining - no gross evidence of ongoing bleeding, but Hgb not yet stable - if rebleeding occurs, GI suggest attempt at embolization in IR   Acute blood loss anemia - s/p 4 U PRBC since admit - transfuse to keep Hgb >/= 7.0 (no hx of CAD)  Tachycardia - related to acute blood loss - resolved   Tobacco abuse - advised to discontinue  Frequent Headaches - persistent HA  - Tylenol and Ultram ineffective - possible component of caffeine withdrawal contributing as pt frequently uses Excedrin for his HAs - trial of imitrex + fioricet - follow - avoid narcotics   High BP Has not hx of HTN - current elevation likely simply due to pain - follow trend   MRSA screen +  Code Status: FULL Family Communication: no family present at time of exam Disposition Plan: transfer to medical bed   Consultants: GI - Outlaw  Procedures: EGD - 10/31 - large deep duodenal  ulcer with adherant clot  Antibiotics: none  DVT prophylaxis: SCDs  Objective: Blood pressure 189/155, pulse 90, temperature 98 F (36.7 C), temperature source Oral, resp. rate 11, height 6' (1.829 m), weight 92.7 kg (204 lb 5.9 oz), SpO2 100 %.  Intake/Output Summary (Last 24 hours) at 10/25/14 0853 Last data filed at 10/25/14 0700  Gross per 24 hour  Intake   3930 ml  Output   4525 ml  Net   -595 ml    Exam: General: No acute respiratory distress Lungs: Clear to auscultation bilaterally without wheezes or crackles Cardiovascular: Regular rate and rhythm without murmur gallop or rub normal S1 and S2 Abdomen: Nontender, nondistended, soft, bowel sounds positive, no rebound, no ascites, no appreciable mass Extremities: No significant cyanosis, clubbing, or edema bilateral lower extremities  Data Reviewed: Basic Metabolic Panel:  Recent Labs Lab 10/23/14 0417 10/24/14 0347 10/25/14 0220  NA 143 138 141  K 4.5 4.4 4.0  CL 112 105 108  CO2 23 24 25   GLUCOSE 116* 125* 103*  BUN 38* 33* 20  CREATININE 0.79 0.85 0.84  CALCIUM 7.5* 7.7* 7.8*  MG 1.8  --   --     Liver Function Tests:  Recent Labs Lab 10/23/14 0417  AST 12  ALT 9  ALKPHOS 43  BILITOT <0.2*  PROT 4.5*  ALBUMIN 2.3*    Coags:  Recent Labs Lab 10/23/14 0417  INR 1.18  Recent Labs Lab 10/23/14 0417  APTT 29    CBC:  Recent Labs Lab 10/23/14 0417 10/23/14 1053 10/24/14 0347 10/24/14 1428 10/25/14 0220  WBC 12.5* 11.5* 16.9*  --  13.1*  HGB 6.3* 7.4* 6.9* 7.6* 7.3*  HCT 18.9* 21.8* 19.6* 21.8* 21.5*  MCV 91.7 88.3 89.1  --  86.0  PLT 197 179 167  --  153   CBG:  Recent Labs Lab 10/23/14 0049 10/24/14 0847  GLUCAP 122* 112*    Recent Results (from the past 240 hour(s))  MRSA PCR Screening     Status: Abnormal   Collection Time: 10/23/14 12:51 AM  Result Value Ref Range Status   MRSA by PCR POSITIVE (A) NEGATIVE Final    Comment:        The GeneXpert MRSA Assay  (FDA approved for NASAL specimens only), is one component of a comprehensive MRSA colonization surveillance program. It is not intended to diagnose MRSA infection nor to guide or monitor treatment for MRSA infections. RESULT CALLED TO, READ BACK BY AND VERIFIED WITH: CALLED TO RN MINDY HOPPER [phone] @0448  THANEY     Studies:  Recent x-ray studies have been reviewed in detail by the Attending Physician  Scheduled Meds:  Scheduled Meds: . Chlorhexidine Gluconate Cloth  6 each Topical Q0600  . mupirocin ointment  1 application Nasal BID  . nicotine  21 mg Transdermal Daily  . [START ON 10/26/2014] pantoprazole (PROTONIX) IV  40 mg Intravenous Q12H    Time spent on care of this patient: 35 mins   Kash Mothershead T , MD   [facility] Office  [phone] Pager - Text Page per Loretha StaplerAmion as per below:  On-Call/Text Page:      [email or web address]      password [email or web address]  If 7PM-7AM, please contact night-coverage [email or web address] Password [email or web address] 10/25/2014, 8:53 AM   LOS: 2 days

## 2014-10-25 NOTE — Plan of Care (Signed)
Problem: Phase II Progression Outcomes Goal: No active bleeding Outcome: Progressing                  

## 2014-10-25 NOTE — Progress Notes (Signed)
Report called to Billee Cashingachel Lesperance, RN @ [phone].

## 2014-10-25 NOTE — Plan of Care (Signed)
Problem: Phase II Progression Outcomes Goal: Tolerating diet Outcome: Adequate for Discharge     

## 2014-10-25 NOTE — Progress Notes (Signed)
[facility] Progress Note  Subjective: Patient feeling okay, wants to eat, denies any bowel movement since his EGD 2 days ago  Objective: Vital signs in last 24 hours: Temp:  [97.6 F (36.4 C)-98.7 F (37.1 C)] 98 F (36.7 C) (11/02 0837) Pulse Rate:  [62-99] 90 (11/02 0800) Resp:  [6-25] 11 (11/02 0800) BP: (95-189)/(45-155) 189/155 mmHg (11/02 0800) SpO2:  [98 %-100 %] 100 % (11/02 0800) Weight change:    ON:[id number]:abdomen soft  Lab Results: Results for orders placed or performed during the hospital encounter of 10/23/14 (from the past 24 hour(s))  Hemoglobin and hematocrit, blood     Status: Abnormal   Collection Time: 10/24/14  2:28 PM  Result Value Ref Range   Hemoglobin 7.6 (L) 13.0 - 17.0 g/dL   HCT 13.221.8 (L) 44.039.0 - 10.252.0 %  CBC     Status: Abnormal   Collection Time: 10/25/14  2:20 AM  Result Value Ref Range   WBC 13.1 (H) 4.0 - 10.5 K/uL   RBC 2.50 (L) 4.22 - 5.81 MIL/uL   Hemoglobin 7.3 (L) 13.0 - 17.0 g/dL   HCT 72.521.5 (L) 36.639.0 - 44.052.0 %   MCV 86.0 78.0 - 100.0 fL   MCH 29.2 26.0 - 34.0 pg   MCHC 34.0 30.0 - 36.0 g/dL   RDW 34.715.3 42.511.5 - 95.615.5 %   Platelets 153 150 - 400 K/uL  Basic metabolic panel     Status: Abnormal   Collection Time: 10/25/14  2:20 AM  Result Value Ref Range   Sodium 141 137 - 147 mEq/L   Potassium 4.0 3.7 - 5.3 mEq/L   Chloride 108 96 - 112 mEq/L   CO2 25 19 - 32 mEq/L   Glucose, Bld 103 (H) 70 - 99 mg/dL   BUN 20 6 - 23 mg/dL   Creatinine, Ser 3.870.84 0.50 - 1.35 mg/dL   Calcium 7.8 (L) 8.4 - 10.5 mg/dL   GFR calc non Af Amer >90 >90 mL/min   GFR calc Af Amer >90 >90 mL/min   Anion gap 8 5 - 15    Studies/Results: No results found.    Assessment: Status post GI bleed from duodenal ulcer, unclear whether still bleeding but suboptimal response to transfusion but no recent stools.  Plan: Transfuse 1 more unit of packed red blood cells. Allow soft diet today, hold nothing by mouth after midnight until reassessed tomorrow for need  for possible repeat EGD. Continue PPI infusion.    Joesphine Schemm C 10/25/2014, 9:21 AM

## 2014-10-25 NOTE — Plan of Care (Signed)
Problem: Phase II Progression Outcomes Goal: H&H stablized < 1gm drop in 24 hrs Outcome: Progressing

## 2014-10-26 LAB — TYPE AND SCREEN
ABO/RH(D): O POS
Antibody Screen: NEGATIVE
UNIT DIVISION: 0
UNIT DIVISION: 0
UNIT DIVISION: 0
Unit division: 0
Unit division: 0

## 2014-10-26 MED ORDER — PANTOPRAZOLE SODIUM 40 MG PO TBEC: 40.0000 mg | DELAYED_RELEASE_TABLET | Freq: Two times a day (BID) | ORAL | Status: DC

## 2014-10-26 NOTE — Discharge Summary (Signed)
Physician Discharge Summary  Steve IraniWilliam R James ZOX:[id number] DOB: [birth year] DOA: 10/23/2014  PCP: No primary care provider on file.  Admit date: 10/23/2014 Discharge date: 10/26/2014  Time spent: 40 minutes  Recommendations for Outpatient Follow-up:  1. Follow-up with Dr. Dulce Sellarutlaw with Steve James in 2-3 weeks. 2. Follow with [facility] in 1 week as outpatient.  Discharge Diagnoses:  Principal Problem:   James bleed Active Problems:   Tobacco abuse   Tachycardia   frequent Headaches   Acute blood loss anemia   Discharge Condition: stable  Diet recommendation: regular diet  Filed Weights   10/23/14 0037  Weight: 92.7 kg (204 lb 5.9 oz)    History of present illness:  Started on Amoxicillin on Monday for a sinus infection. Started bloody diarrhea on Tuesday. Stopped the ABX but continued w/ bloody diarrhea 2-3x daily. Became lightheaded today. Associated w/ epigastric burning pain. Feels like his bleeding ulcer that he had in 1978. Pt takes up to 5 advil and 2 excedrine for frequent HAs (3 per week). Increased use of NSAIDs over the past month. [facility] and [facility] here for James evaluation. Started on NS 250mg /hr and protonix drip.   HAs area throbbing in nature, on one side of head. Associated w/ photophobia or phonophobia, nausea, and weakness  Hospital Course:    James bleed secondary to duodenal ulcer - Presented with bloody diarrhea, lightheadedness and epigastric burning pain. - s/p EGD - found to have a duodenal ulcer with adherant clot - no further bleeding noted - advised to discontinue NSAIDs - was initially on Protonix infusion, James recommended Protonix twice a day on discharge. - Diet advanced to clears and then regular diet, tolerated well, for discharge today. - Follow with [facility] as outpatient.   Acute blood loss anemia -presented with hemoglobin of 6.3, acute blood loss is secondary to duodenal ulcer. -status post transfusion of 4 units of packed  RBCs, hemoglobin yesterday 8.8. -Patient refused blood draws today after he knew that he is going home.    Tachycardia - related to acute blood loss- resolved.   Tobacco abuse - advised to discontinue   Frequent Headaches - Tylenol as needed, will probably benefit from referral to neurologist if this is continues.  Procedures:  none  Consultations:  none  Discharge Exam: Filed Vitals:   10/26/14 0640  BP: 118/62  Pulse: 66  Temp: 98.4 F (36.9 C)  Resp: 12   General: Alert and awake, oriented x3, not in any acute distress. HEENT: anicteric sclera, pupils reactive to light and accommodation, EOMI CVS: S1-S2 clear, no murmur rubs or gallops Chest: clear to auscultation bilaterally, no wheezing, rales or rhonchi Abdomen: soft nontender, nondistended, normal bowel sounds, no organomegaly Extremities: no cyanosis, clubbing or edema noted bilaterally Neuro: Cranial nerves II-XII intact, no focal neurological deficits  Discharge Instructions You were cared for by a hospitalist during your hospital stay. If you have any questions about your discharge medications or the care you received while you were in the hospital after you are discharged, you can call the unit and asked to speak with the hospitalist on call if the hospitalist that took care of you is not available. Once you are discharged, your primary care physician will handle any further medical issues. Please note that NO REFILLS for any discharge medications will be authorized once you are discharged, as it is imperative that you return to your primary care physician (or establish a relationship with a primary care physician if you  do not have one) for your aftercare needs so that they can reassess your need for medications and monitor your lab values.  Discharge Instructions    Increase activity slowly    Complete by:  As directed           Current Discharge Medication List    START taking these medications    Details  pantoprazole (PROTONIX) 40 MG tablet Take 1 tablet (40 mg total) by mouth 2 (two) times daily. Qty: 60 tablet, Refills: 0      CONTINUE these medications which have NOT CHANGED   Details  Aromatic Inhalants (VICKS VAPOINHALER) INHA Inhale into the lungs 5 (five) times daily as needed (congestion).    diphenhydrAMINE (BENADRYL) 25 MG tablet Take 50 mg by mouth every 4 (four) hours as needed (bee sting reaction).       Allergies  Allergen Reactions  . Amoxicillin Other (See Comments)    May have aggravated the ulcer/ caused bleeding  . Aspirin Nausea Only  . Bee Pollen Swelling  . Bee Venom Swelling   Follow-up Information    Follow up with Steve James,Steve M, MD In 2 weeks.   Specialty:  Gastroenterology   Contact information:   [address] [place] [address] [phone]        The results of significant diagnostics from this hospitalization (including imaging, microbiology, ancillary and laboratory) are listed below for reference.    Significant Diagnostic Studies: No results found.  Microbiology: Recent Results (from the past 240 hour(s))  MRSA PCR Screening     Status: Abnormal   Collection Time: 10/23/14 12:51 AM  Result Value Ref Range Status   MRSA by PCR POSITIVE (A) NEGATIVE Final    Comment:        The GeneXpert MRSA Assay (FDA approved for NASAL specimens only), is one component of a comprehensive MRSA colonization surveillance program. It is not intended to diagnose MRSA infection nor to guide or monitor treatment for MRSA infections. RESULT CALLED TO, READ BACK BY AND VERIFIED WITH: CALLED TO RN MINDY HOPPER [phone] @[phone]  THANEY     Labs: Basic Metabolic Panel:  Recent Labs Lab 10/23/14 0417 10/24/14 0347 10/25/14 0220  NA 143 138 141  K 4.5 4.4 4.0  CL 112 105 108  CO2 23 24 25   GLUCOSE 116* 125* 103*  BUN 38* 33* 20  CREATININE 0.79 0.85 0.84  CALCIUM 7.5* 7.7* 7.8*  MG 1.8  --   --    Liver Function  Tests:  Recent Labs Lab 10/23/14 0417  AST 12  ALT 9  ALKPHOS 43  BILITOT <0.2*  PROT 4.5*  ALBUMIN 2.3*   No results for input(s): LIPASE, AMYLASE in the last 168 hours. No results for input(s): AMMONIA in the last 168 hours. CBC:  Recent Labs Lab 10/23/14 0417 10/23/14 1053 10/24/14 0347 10/24/14 1428 10/25/14 0220 10/25/14 1335  WBC 12.5* 11.5* 16.9*  --  13.1* 11.2*  HGB 6.3* 7.4* 6.9* 7.6* 7.3* 8.8*  HCT 18.9* 21.8* 19.6* 21.8* 21.5* 25.7*  MCV 91.7 88.3 89.1  --  86.0 89.2  PLT 197 179 167  --  153 172   Cardiac Enzymes:  Recent Labs Lab 10/23/14 0417  TROPONINI <0.30   BNP: BNP (last 3 results) No results for input(s): PROBNP in the last 8760 hours. CBG:  Recent Labs Lab 10/23/14 0049 10/24/14 0847  GLUCAP 122* 112*       Signed:  Cordelia Bessinger A  [facility] 10/26/2014,  10:36 AM

## 2014-10-26 NOTE — Progress Notes (Signed)
[facility] Progress Note  Subjective: The patient had 1 stool that was nearly normal in color and not sticky.  Objective: Vital signs in last 24 hours: Temp:  [98 F (36.7 C)-99.2 F (37.3 C)] 98.4 F (36.9 C) (11/03 0640) Pulse Rate:  [60-94] 66 (11/03 0640) Resp:  [7-18] 12 (11/03 0640) BP: (104-136)/(56-63) 118/62 mmHg (11/03 0640) SpO2:  [97 %-100 %] 99 % (11/03 0640) Weight change:    UJ:[id number]:abdomen soft  Lab Results: Results for orders placed or performed during the hospital encounter of 10/23/14 (from the past 24 hour(s))  Prepare RBC     Status: None   Collection Time: 10/25/14 10:00 AM  Result Value Ref Range   Order Confirmation ORDER PROCESSED BY BLOOD BANK   CBC     Status: Abnormal   Collection Time: 10/25/14  1:35 PM  Result Value Ref Range   WBC 11.2 (H) 4.0 - 10.5 K/uL   RBC 2.88 (L) 4.22 - 5.81 MIL/uL   Hemoglobin 8.8 (L) 13.0 - 17.0 g/dL   HCT 82.925.7 (L) 56.239.0 - 13.052.0 %   MCV 89.2 78.0 - 100.0 fL   MCH 30.6 26.0 - 34.0 pg   MCHC 34.2 30.0 - 36.0 g/dL   RDW 86.515.1 78.411.5 - 69.615.5 %   Platelets 172 150 - 400 K/uL    Studies/Results: No results found.    Assessment: Duodenal ulcer seems to have stopped bleeding, treated with proton pump inhibitor  Plan: Double dose proton pump inhibitor by mouth. Okay for discharge from GI standpoint follow-up with Dr. Dulce Sellarutlaw and 2-3 weeks. He was considering possible repeat endoscopy because of the size of the ulcer.    Tanecia Mccay C 10/26/2014, 9:03 AM

## 2014-10-26 NOTE — Care Management Note (Signed)
    Page 1 of 1   10/26/2014     10:54:15 AM CARE MANAGEMENT NOTE 10/26/2014  Patient:  Teena IraniKEOWN,Austine R   Account Number:  [id number]  Date Initiated:  10/26/2014  Documentation initiated by:  Letha CapeAYLOR,Art Levan  Subjective/Objective Assessment:   dx gib  admit- lives with spouse. pta indep.     Action/Plan:   Anticipated DC Date:  10/26/2014   Anticipated DC Plan:  HOME/SELF CARE      DC Planning Services  CM consult      Choice offered to / List presented to:             Status of service:  Completed, signed off Medicare Important Message given?  NO (If response is "NO", the following Medicare IM given date fields will be blank) Date Medicare IM given:   Medicare IM given by:   Date Additional Medicare IM given:   Additional Medicare IM given by:    Discharge Disposition:  HOME/SELF CARE  Per UR Regulation:  Reviewed for med. necessity/level of care/duration of stay  If discussed at Long Length of Stay Meetings, dates discussed:    Comments:  10/26/14 1050 Letha Capeeborah Rafiel Mecca RN, BSN [phone] patient is for dc today, patient has Medicaid his id number is [id number] Q, NCM gave this information to the financial counselor, also [facility] Nlo needs anticipated.

## 2015-07-22 ENCOUNTER — Ambulatory Visit (INDEPENDENT_AMBULATORY_CARE_PROVIDER_SITE_OTHER): Payer: Medicaid Other | Admitting: Family Medicine

## 2015-07-22 ENCOUNTER — Encounter: Payer: Self-pay | Admitting: Family Medicine

## 2015-07-22 VITALS — BP 137/87 | HR 94 | Temp 98.1°F | Ht 72.0 in | Wt 209.6 lb

## 2015-07-22 DIAGNOSIS — J301 Allergic rhinitis due to pollen: Secondary | ICD-10-CM

## 2015-07-22 DIAGNOSIS — T7840XA Allergy, unspecified, initial encounter: Secondary | ICD-10-CM

## 2015-07-22 DIAGNOSIS — N529 Male erectile dysfunction, unspecified: Secondary | ICD-10-CM | POA: Insufficient documentation

## 2015-07-22 DIAGNOSIS — G4452 New daily persistent headache (NDPH): Secondary | ICD-10-CM | POA: Diagnosis not present

## 2015-07-22 LAB — POCT CBC
GRANULOCYTE PERCENT: 51.5 % (ref 37–80)
HCT, POC: 45.6 % (ref 43.5–53.7)
Hemoglobin: 15.2 g/dL (ref 14.1–18.1)
Lymph, poc: 3.8 — AB (ref 0.6–3.4)
MCH: 29.8 pg (ref 27–31.2)
MCHC: 33.3 g/dL (ref 31.8–35.4)
MCV: 89.4 fL (ref 80–97)
MPV: 8.4 fL (ref 0–99.8)
POC Granulocyte: 4.7 (ref 2–6.9)
POC LYMPH PERCENT: 42.1 %L (ref 10–50)
Platelet Count, POC: 249 10*3/uL (ref 142–424)
RBC: 5.1 M/uL (ref 4.69–6.13)
RDW, POC: 12.4 %
WBC: 9.1 10*3/uL (ref 4.6–10.2)

## 2015-07-22 MED ORDER — FEXOFENADINE HCL 180 MG PO TABS: 180.0000 mg | ORAL_TABLET | Freq: Every evening | ORAL | Status: DC

## 2015-07-22 MED ORDER — BETAMETHASONE SOD PHOS & ACET 6 (3-3) MG/ML IJ SUSP
6.0000 mg | Freq: Once | INTRAMUSCULAR | Status: AC
  Administered 2015-07-22: 6 mg via INTRAMUSCULAR

## 2015-07-22 MED ORDER — SILDENAFIL CITRATE 100 MG PO TABS: 100.0000 mg | ORAL_TABLET | Freq: Every day | ORAL | Status: DC | PRN

## 2015-07-22 MED ORDER — FEXOFENADINE-PSEUDOEPHED ER 60-120 MG PO TB12: 1.0000 | ORAL_TABLET | ORAL | Status: DC

## 2015-07-22 MED ORDER — SUMATRIPTAN SUCCINATE 100 MG PO TABS: 100.0000 mg | ORAL_TABLET | ORAL | Status: DC | PRN

## 2015-07-22 MED ORDER — PREDNISONE 10 MG PO TABS: ORAL_TABLET | ORAL | Status: DC

## 2015-07-22 NOTE — Progress Notes (Signed)
Subjective:  Patient ID: Steve James, male    DOB: 1966/11/26  Age: 49 y.o. MRN: [id number]  CC: Sinusitis and Headache    Steve James presents for HA 2days, twice a week for 2-3 mos. Feels like hit in head by a 2X4. Parietal location bilaterally. Severe in intensity of pain rating 8-9/10. Debilitating. As the headache develops his Sinus start pouring he has clear rhinorrhea and posterior nasal drainage.. Taking various OTC analgesics. A little relief. Eyes water and Frontal HA radiates to Parietal scalp. 9/10. Head exploding pain. No photophobia. No phonophobia.  Patient also having problem with erections. Unable to become erect enough to achieve satisfactory penetration and climax. This is occasional. He is sexually active routinely. Not every occasion leads to a problem. He would like to have a backup for those times that to lead to ED.  History Steve James has a past medical history of H/O: GI bleed (1978) and Headache.   He has past surgical history that includes Esophagogastroduodenoscopy (N/A, 10/23/2014).   His family history is not on file.He reports that he has been smoking Cigarettes.  He has been smoking about 1.00 pack per day. He does not have any smokeless tobacco history on file. He reports that he does not drink alcohol or use illicit drugs.  No current outpatient prescriptions on file prior to visit.   No current facility-administered medications on file prior to visit.    ROS Review of Systems  Constitutional: Negative for fever, chills, activity change and appetite change.  HENT: Positive for congestion, postnasal drip, rhinorrhea and sinus pressure. Negative for ear discharge, ear pain, hearing loss, nosebleeds, sneezing and trouble swallowing.   Respiratory: Positive for cough. Negative for chest tightness and shortness of breath.   Cardiovascular: Negative for chest pain and palpitations.  Skin: Negative for rash.    Objective:  BP 137/87 mmHg  Pulse 94   Temp(Src) 98.1 F (36.7 C) (Oral)  Ht 6' (1.829 m)  Wt 209 lb 9.6 oz (95.074 kg)  BMI 28.42 kg/m2  Physical Exam  Constitutional: He appears well-developed and well-nourished.  HENT:  Head: Normocephalic and atraumatic.  Right Ear: Tympanic membrane and external ear normal. No decreased hearing is noted.  Left Ear: Tympanic membrane and external ear normal. No decreased hearing is noted.  Nose: Mucosal edema present. Right sinus exhibits no frontal sinus tenderness. Left sinus exhibits no frontal sinus tenderness.  Mouth/Throat: No oropharyngeal exudate or posterior oropharyngeal erythema.  Neck: No Brudzinski's sign noted.  Pulmonary/Chest: Breath sounds normal. No respiratory distress.  Lymphadenopathy:       Head (right side): No preauricular adenopathy present.       Head (left side): No preauricular adenopathy present.       Right cervical: No superficial cervical adenopathy present.      Left cervical: No superficial cervical adenopathy present.    Assessment & Plan:   Steve James was seen today for sinusitis and headache.  Diagnoses and all orders for this visit:  New daily persistent headache Orders: -     POCT CBC -     CMP14+EGFR -     Sedimentation rate -     MR Brain Wo Contrast; Future  Allergic reaction, initial encounter Orders: -     POCT CBC -     CMP14+EGFR -     Sedimentation rate -     Ambulatory referral to Allergy -     betamethasone acetate-betamethasone sodium phosphate (CELESTONE) injection 6 mg; Inject 1  mL (6 mg total) into the muscle once.  Erectile dysfunction, unspecified erectile dysfunction type Orders: -     Testosterone,Free and Total  Other orders -     predniSONE (DELTASONE) 10 MG tablet; Take 5 daily for 3 days followed by 4,3,2 and 1 for 3 days each. -     SUMAtriptan (IMITREX) 100 MG tablet; Take 1 tablet (100 mg total) by mouth every 2 (two) hours as needed for migraine. May repeat in 2 hours if headache persists or recurs. -      sildenafil (VIAGRA) 100 MG tablet; Take 1 tablet (100 mg total) by mouth daily as needed for erectile dysfunction. -     fexofenadine-pseudoephedrine (ALLEGRA-D ALLERGY & CONGESTION) 60-120 MG per tablet; Take 1 tablet by mouth every morning. For allergy and congestion -     fexofenadine (ALLEGRA) 180 MG tablet; Take 1 tablet (180 mg total) by mouth every evening.   I have discontinued Mr. Schar diphenhydrAMINE, VICKS VAPOINHALER, and pantoprazole. I am also having him start on predniSONE, SUMAtriptan, sildenafil, fexofenadine-pseudoephedrine, and fexofenadine. We administered betamethasone acetate-betamethasone sodium phosphate.  Meds ordered this encounter  Medications  . betamethasone acetate-betamethasone sodium phosphate (CELESTONE) injection 6 mg    Sig:   . predniSONE (DELTASONE) 10 MG tablet    Sig: Take 5 daily for 3 days followed by 4,3,2 and 1 for 3 days each.    Dispense:  45 tablet    Refill:  0  . SUMAtriptan (IMITREX) 100 MG tablet    Sig: Take 1 tablet (100 mg total) by mouth every 2 (two) hours as needed for migraine. May repeat in 2 hours if headache persists or recurs.    Dispense:  10 tablet    Refill:  0  . sildenafil (VIAGRA) 100 MG tablet    Sig: Take 1 tablet (100 mg total) by mouth daily as needed for erectile dysfunction.    Dispense:  10 tablet    Refill:  10  . fexofenadine-pseudoephedrine (ALLEGRA-D ALLERGY & CONGESTION) 60-120 MG per tablet    Sig: Take 1 tablet by mouth every morning. For allergy and congestion    Dispense:  30 tablet    Refill:  5  . fexofenadine (ALLEGRA) 180 MG tablet    Sig: Take 1 tablet (180 mg total) by mouth every evening.    Dispense:  30 tablet    Refill:  5     Follow-up: Return in about 1 month (around 08/22/2015).  Claretta Fraise, M.D.

## 2015-07-24 LAB — CMP14+EGFR
ALT: 14 IU/L (ref 0–44)
AST: 16 IU/L (ref 0–40)
Albumin/Globulin Ratio: 1.6 (ref 1.1–2.5)
Albumin: 4.2 g/dL (ref 3.5–5.5)
Alkaline Phosphatase: 71 IU/L (ref 39–117)
BUN/Creatinine Ratio: 18 (ref 9–20)
BUN: 16 mg/dL (ref 6–24)
Bilirubin Total: 0.3 mg/dL (ref 0.0–1.2)
CALCIUM: 9.8 mg/dL (ref 8.7–10.2)
CO2: 25 mmol/L (ref 18–29)
CREATININE: 0.9 mg/dL (ref 0.76–1.27)
Chloride: 100 mmol/L (ref 97–108)
GFR calc Af Amer: 114 mL/min/{1.73_m2} (ref >59)
GFR, EST NON AFRICAN AMERICAN: 99 mL/min/{1.73_m2} (ref >59)
GLOBULIN, TOTAL: 2.7 g/dL (ref 1.5–4.5)
GLUCOSE: 86 mg/dL (ref 65–99)
Potassium: 4.9 mmol/L (ref 3.5–5.2)
SODIUM: 140 mmol/L (ref 134–144)
TOTAL PROTEIN: 6.9 g/dL (ref 6.0–8.5)

## 2015-07-24 LAB — SEDIMENTATION RATE: Sed Rate: 2 mm/hr (ref 0–30)

## 2015-07-24 LAB — TESTOSTERONE,FREE AND TOTAL
Testosterone, Free: 4.9 pg/mL — ABNORMAL LOW (ref 7.2–24.0)
Testosterone: 380 ng/dL (ref 348–1197)

## 2015-10-07 DIAGNOSIS — J309 Allergic rhinitis, unspecified: Secondary | ICD-10-CM | POA: Insufficient documentation

## 2015-10-07 DIAGNOSIS — Z720 Tobacco use: Secondary | ICD-10-CM | POA: Insufficient documentation

## 2015-10-10 ENCOUNTER — Ambulatory Visit: Payer: Medicaid Other | Admitting: Pediatrics

## 2016-03-20 ENCOUNTER — Ambulatory Visit: Payer: Medicaid Other | Admitting: Family Medicine

## 2016-03-21 ENCOUNTER — Encounter: Payer: Self-pay | Admitting: Family Medicine

## 2018-06-08 ENCOUNTER — Encounter (HOSPITAL_COMMUNITY): Payer: Self-pay | Admitting: Emergency Medicine

## 2018-06-08 ENCOUNTER — Other Ambulatory Visit: Payer: Self-pay

## 2018-06-08 ENCOUNTER — Emergency Department (HOSPITAL_COMMUNITY)
Admission: EM | Admit: 2018-06-08 | Discharge: 2018-06-08 | Disposition: A | Discharge status: Home / Self Care | Payer: Self-pay | Attending: Emergency Medicine | Admitting: Emergency Medicine

## 2018-06-08 DIAGNOSIS — Z79899 Other long term (current) drug therapy: Secondary | ICD-10-CM | POA: Insufficient documentation

## 2018-06-08 DIAGNOSIS — L03211 Cellulitis of face: Secondary | ICD-10-CM | POA: Insufficient documentation

## 2018-06-08 LAB — CBG MONITORING, ED: Glucose-Capillary: 132 mg/dL — ABNORMAL HIGH (ref 65–99)

## 2018-06-08 MED ORDER — DOXYCYCLINE HYCLATE 100 MG PO CAPS: 100.0000 mg | ORAL_CAPSULE | Freq: Two times a day (BID) | ORAL | 0 refills | Status: AC

## 2018-06-08 MED ORDER — SODIUM CHLORIDE 0.9 % IV SOLN
1500.0000 mg | Freq: Once | INTRAVENOUS | Status: AC
  Administered 2018-06-08: 1500 mg via INTRAVENOUS
  Filled 2018-06-08 (×2): qty 1500

## 2018-06-08 NOTE — ED Triage Notes (Signed)
Patient c/o possible bug bite/abscess to left side of nose. Patient states progressively getting bigger. Unsure of any fevers. Patient states that he had to be admitted for something similar a year ago due to the same location. Patient does report some purulent drainage.

## 2018-06-08 NOTE — ED Provider Notes (Signed)
[facility] EMERGENCY DEPARTMENT Provider Note   CSN: [id number] Arrival date & time: 06/08/18  1453     History   Chief Complaint Chief Complaint  Patient presents with  . Abscess    HPI Steve James is a 52 y.o. male.  Skin irritation on the left side of the nose for several days.  Patient required hospitalization approximately 1 year ago for an abscess in that area.  No known history of diabetes.  No fever, sweats, chills.  He was able to express a small amount of pus.  Severity is mild.  Palpation makes symptoms worse.     Past Medical History:  Diagnosis Date  . H/O: GI bleed 1978  . Headache     Patient Active Problem List   Diagnosis Date Noted  . Allergic rhinitis 10/07/2015  . Tobacco use 10/07/2015  . Erectile dysfunction 07/22/2015  . GI bleed 10/23/2014  . Tobacco abuse 10/23/2014  . Tachycardia 10/23/2014  . frequent Headaches 10/23/2014  . Acute blood loss anemia 10/23/2014    Past Surgical History:  Procedure Laterality Date  . ESOPHAGOGASTRODUODENOSCOPY N/A 10/23/2014   Procedure: ESOPHAGOGASTRODUODENOSCOPY (EGD);  Surgeon: Willis ModenaWilliam Outlaw, MD;  [facility];  Service: Endoscopy;  Laterality: N/A;        Home Medications    Prior to Admission medications   Medication Sig Start Date End Date Taking? Authorizing Provider  aspirin-acetaminophen-caffeine (EXCEDRIN MIGRAINE) [phone] MG tablet Take 2 tablets by mouth every 6 (six) hours as needed for headache.   Yes [provider]  ibuprofen (ADVIL,MOTRIN) 200 MG tablet Take 600 mg by mouth every 6 (six) hours as needed for moderate pain.   Yes [provider]  doxycycline (VIBRAMYCIN) 100 MG capsule Take 1 capsule (100 mg total) by mouth 2 (two) times daily. 06/08/18   Donnetta Hutchingook, Samel Bruna, MD  guaiFENesin-codeine 100-10 MG/5ML syrup Take 5 mLs by mouth as needed. 03/14/18   [provider]    Family History No family history on file.  Social History Social  History   Tobacco Use  . Smoking status: Current Every Day Smoker    Packs/day: 1.00    Types: Cigarettes  . Smokeless tobacco: Never Used  Substance Use Topics  . Alcohol use: No  . Drug use: No     Allergies   Amoxicillin; Aspirin; Bee pollen; Bee venom; and Penicillins   Review of Systems Review of Systems  All other systems reviewed and are negative.    Physical Exam Updated Vital Signs BP (!) 129/91   Pulse 74   Temp 98.6 F (37 C) (Oral)   Resp 16   Ht 6' (1.829 m)   Wt 95.3 kg (210 lb)   SpO2 98%   BMI 28.48 kg/m   Physical Exam  Constitutional: He is oriented to person, place, and time. He appears well-developed and well-nourished.  HENT:  Head: Normocephalic and atraumatic.  Eyes: Conjunctivae are normal.  Neck: Neck supple.  Cardiovascular: Normal rate and regular rhythm.  Pulmonary/Chest: Effort normal and breath sounds normal.  Abdominal: Soft. Bowel sounds are normal.  Musculoskeletal: Normal range of motion.  Neurological: He is alert and oriented to person, place, and time.  Skin:  Face exam: Area of erythema and induration on the left side of the nose approximately 2.5 cm in diameter.  No palpable pus pocket.  Psychiatric: He has a normal mood and affect. His behavior is normal.  Nursing note and vitals reviewed.    ED Treatments / Results  Labs (all labs ordered are listed, but only abnormal results are displayed) Labs Reviewed  CBG MONITORING, ED - Abnormal; Notable for the following components:      Result Value   Glucose-Capillary 132 (*)    All other components within normal limits  BASIC METABOLIC PANEL    EKG None  Radiology No results found.  Procedures Procedures (including critical care time)  Medications Ordered in ED Medications  vancomycin (VANCOCIN) 1,500 mg in sodium chloride 0.9 % 500 mL IVPB (1,500 mg Intravenous New Bag/Given 06/08/18 1638)     Initial Impression / Assessment and Plan / ED Course  I have  reviewed the triage vital signs and the nursing notes.  Pertinent labs & imaging results that were available during my care of the patient were reviewed by me and considered in my medical decision making (see chart for details).     Pt presents with cellulitis in the face.  There are no obvious pus pocket for incision and drainage.  IV vancomycin.  Discharge antibiotic doxycycline 100 mg.  Final Clinical Impressions(s) / ED Diagnoses   Final diagnoses:  Cellulitis of face    ED Discharge Orders        Ordered    doxycycline (VIBRAMYCIN) 100 MG capsule  2 times daily     06/08/18 1806       Donnetta Hutching, MD 06/08/18 1810

## 2018-06-08 NOTE — Discharge Instructions (Signed)
Glucose was minimally elevated at 132 (Normal 100).  Keep area very clean.  Prescription for antibiotic.  Return if worse.

## 2018-06-08 NOTE — Progress Notes (Signed)
Pharmacy Note:  Initial antibiotic(s) regimen of Vancomcyin ordered by EDP to treat Cellulitis    CrCl cannot be calculated (Patient's most recent lab result is older than the maximum 21 days allowed.).   Allergies  Allergen Reactions  . Amoxicillin Other (See Comments)    May have aggravated the ulcer/ caused bleeding  . Aspirin Nausea Only  . Bee Pollen Swelling  . Bee Venom Swelling  . Penicillins Other (See Comments)    Gi bleed     Vitals:   06/08/18 1505  BP: (S) (!) 153/112  Pulse: 86  Resp: 20  Temp: 98.6 F (37 C)  SpO2: 98%    Anti-infectives (From admission, onward)   Start     Dose/Rate Route Frequency Ordered Stop   06/08/18 1700  vancomycin (VANCOCIN) 1,500 mg in sodium chloride 0.9 % 500 mL IVPB     1,500 mg 250 mL/hr over 120 Minutes Intravenous  Once 06/08/18 1622        Antimicrobials this admission:  Vanc 6/16 >>      >>   Dose adjustments this admission:  n/a  Microbiology results:   BCx:   UCx:    Sputum:    MRSA PCR:    Plan: Initial dose(s) of Vancomycin X 1 ordered. F/U admission orders for further dosing if therapy continued.  Mady GemmaHayes, Cadence Haslam R, [facility] 06/08/2018 4:22 PM

## 2018-06-08 NOTE — ED Notes (Signed)
Pt ambulating to bathroom with steady gait and denies dizziness.

## 2022-11-07 ENCOUNTER — Inpatient Hospital Stay: Admit: 2022-11-07 | Payer: MEDICAID | Admitting: Family Medicine

## 2022-11-07 ENCOUNTER — Encounter (HOSPITAL_COMMUNITY): Payer: Self-pay
